# Patient Record
Sex: Female | Born: 1995 | Race: Black or African American | Hispanic: No | Marital: Single | Smoking: Current every day smoker
Health system: Southern US, Community
[De-identification: ages and names within clinical notes are randomized; demographics above are authoritative.]

---
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
## 2015-07-14 ENCOUNTER — Emergency Department (HOSPITAL_COMMUNITY): Payer: Medicaid Other

## 2015-07-14 ENCOUNTER — Encounter (HOSPITAL_COMMUNITY): Payer: Self-pay | Admitting: Family Medicine

## 2015-07-14 ENCOUNTER — Emergency Department (HOSPITAL_COMMUNITY)
Admission: EM | Admit: 2015-07-14 | Discharge: 2015-07-14 | Disposition: A | Discharge status: Home / Self Care | Payer: Medicaid Other | Attending: Emergency Medicine | Admitting: Emergency Medicine

## 2015-07-14 DIAGNOSIS — R079 Chest pain, unspecified: Secondary | ICD-10-CM | POA: Insufficient documentation

## 2015-07-14 DIAGNOSIS — Z79899 Other long term (current) drug therapy: Secondary | ICD-10-CM | POA: Diagnosis not present

## 2015-07-14 DIAGNOSIS — J069 Acute upper respiratory infection, unspecified: Secondary | ICD-10-CM | POA: Diagnosis not present

## 2015-07-14 DIAGNOSIS — Z7982 Long term (current) use of aspirin: Secondary | ICD-10-CM | POA: Diagnosis not present

## 2015-07-14 DIAGNOSIS — B9789 Other viral agents as the cause of diseases classified elsewhere: Secondary | ICD-10-CM

## 2015-07-14 DIAGNOSIS — R05 Cough: Secondary | ICD-10-CM | POA: Diagnosis present

## 2015-07-14 DIAGNOSIS — F17200 Nicotine dependence, unspecified, uncomplicated: Secondary | ICD-10-CM | POA: Diagnosis not present

## 2015-07-14 LAB — RAPID STREP SCREEN (MED CTR MEBANE ONLY): STREPTOCOCCUS, GROUP A SCREEN (DIRECT): NEGATIVE

## 2015-07-14 MED ORDER — ACETAMINOPHEN 160 MG/5ML PO SOLN
500.0000 mg | Freq: Once | ORAL | Status: AC
  Administered 2015-07-14: 500 mg via ORAL
  Filled 2015-07-14: qty 20.3

## 2015-07-14 NOTE — ED Notes (Signed)
Pt here for sore throat, cough, headache and body aches with chills.
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
## 2015-07-14 NOTE — ED Notes (Signed)
Patient transported to X-ray 

## 2015-07-16 LAB — CULTURE, GROUP A STREP (THRC)

## 2015-11-28 ENCOUNTER — Other Ambulatory Visit (HOSPITAL_COMMUNITY)
Admission: RE | Admit: 2015-11-28 | Discharge: 2015-11-28 | Disposition: A | Discharge status: Home / Self Care | Payer: Medicaid Other | Source: Ambulatory Visit | Attending: Internal Medicine | Admitting: Internal Medicine

## 2015-11-28 ENCOUNTER — Encounter: Payer: Self-pay | Admitting: Internal Medicine

## 2015-11-28 ENCOUNTER — Ambulatory Visit: Payer: Medicaid Other | Attending: Internal Medicine | Admitting: Internal Medicine

## 2015-11-28 VITALS — BP 93/63 | HR 74 | Temp 98.1°F | Resp 16 | Ht 61.5 in | Wt 139.4 lb

## 2015-11-28 DIAGNOSIS — Z131 Encounter for screening for diabetes mellitus: Secondary | ICD-10-CM

## 2015-11-28 DIAGNOSIS — N760 Acute vaginitis: Secondary | ICD-10-CM | POA: Insufficient documentation

## 2015-11-28 DIAGNOSIS — Z113 Encounter for screening for infections with a predominantly sexual mode of transmission: Secondary | ICD-10-CM | POA: Diagnosis present

## 2015-11-28 DIAGNOSIS — Z124 Encounter for screening for malignant neoplasm of cervix: Secondary | ICD-10-CM | POA: Diagnosis not present

## 2015-11-28 DIAGNOSIS — F17210 Nicotine dependence, cigarettes, uncomplicated: Secondary | ICD-10-CM | POA: Diagnosis not present

## 2015-11-28 DIAGNOSIS — Z720 Tobacco use: Secondary | ICD-10-CM

## 2015-11-28 DIAGNOSIS — Z0000 Encounter for general adult medical examination without abnormal findings: Secondary | ICD-10-CM | POA: Diagnosis not present

## 2015-11-28 DIAGNOSIS — Z01419 Encounter for gynecological examination (general) (routine) without abnormal findings: Secondary | ICD-10-CM | POA: Insufficient documentation

## 2015-11-28 DIAGNOSIS — F17200 Nicotine dependence, unspecified, uncomplicated: Secondary | ICD-10-CM

## 2015-11-28 DIAGNOSIS — Z1329 Encounter for screening for other suspected endocrine disorder: Secondary | ICD-10-CM | POA: Diagnosis not present

## 2015-11-28 LAB — POCT URINE PREGNANCY: PREG TEST UR: NEGATIVE

## 2015-11-28 LAB — BASIC METABOLIC PANEL WITH GFR
BUN: 12 mg/dL (ref 7–25)
CO2: 23 mmol/L (ref 20–31)
CREATININE: 0.96 mg/dL (ref 0.50–1.10)
Calcium: 9.5 mg/dL (ref 8.6–10.2)
Chloride: 106 mmol/L (ref 98–110)
GFR, Est African American: >89 mL/min (ref >=60)
GFR, Est Non African American: 85 mL/min (ref >=60)
Glucose, Bld: 79 mg/dL (ref 65–99)
POTASSIUM: 4.4 mmol/L (ref 3.5–5.3)
SODIUM: 137 mmol/L (ref 135–146)

## 2015-11-28 LAB — POCT URINALYSIS DIPSTICK
Bilirubin, UA: NEGATIVE
Glucose, UA: NEGATIVE
KETONES UA: NEGATIVE
LEUKOCYTES UA: NEGATIVE
Nitrite, UA: NEGATIVE
PH UA: 6
Protein, UA: NEGATIVE
Spec Grav, UA: 1.02
Urobilinogen, UA: 0.2

## 2015-11-28 LAB — CBC WITH DIFFERENTIAL/PLATELET
BASOS PCT: 0 %
Basophils Absolute: 0 cells/uL (ref 0–200)
EOS PCT: 2 %
Eosinophils Absolute: 106 cells/uL (ref 15–500)
HCT: 40.3 % (ref 35.0–45.0)
Hemoglobin: 12.9 g/dL (ref 11.7–15.5)
Lymphocytes Relative: 39 %
Lymphs Abs: 2067 cells/uL (ref 850–3900)
MCH: 27 pg (ref 27.0–33.0)
MCHC: 32 g/dL (ref 32.0–36.0)
MCV: 84.3 fL (ref 80.0–100.0)
MONOS PCT: 6 %
MPV: 10.1 fL (ref 7.5–12.5)
Monocytes Absolute: 318 cells/uL (ref 200–950)
Neutro Abs: 2809 cells/uL (ref 1500–7800)
Neutrophils Relative %: 53 %
Platelets: 317 10*3/uL (ref 140–400)
RBC: 4.78 MIL/uL (ref 3.80–5.10)
RDW: 13.4 % (ref 11.0–15.0)
WBC: 5.3 10*3/uL (ref 3.8–10.8)

## 2015-11-28 LAB — TSH: TSH: 0.96 m[IU]/L

## 2015-11-28 LAB — POCT GLYCOSYLATED HEMOGLOBIN (HGB A1C): HEMOGLOBIN A1C: 5.4

## 2015-11-28 NOTE — Progress Notes (Signed)
Dawn Brewer, is a 20 y.o. female  ZOX:[id number]  [id number]  DOB - 1995-12-21  CC:  Chief Complaint  Patient presents with  . Establish Care       HPI: Dawn Brewer is a 20 y.o. female g0p0 here today to establish medical care, new to our clinic.  Last seen in ED 07/14/15 for uri, resolved.  She has no c/o today, regular menses. Is in sexual relationship w/ longterm partner of 3 years, female, not using OCP or protection at this time.  Never had papsmear prior.  Denies any abnml nipple discharge, no abnml vaginal discharge, no pruritis.   Smokes 2-3 cig /day and occasionally marijuana.    Patient has No headache, No chest pain, No abdominal pain - No Nausea, No new weakness tingling or numbness, No Cough - SOB.    Review of Systems: Per HPI, o/w all systems reviewed and negative.   No Known Allergies No past medical history on file. No current outpatient prescriptions on file prior to visit.   No current facility-administered medications on file prior to visit.    No family history on file. Social History   Social History  . Marital status: Single    Spouse name: N/A  . Number of children: N/A  . Years of education: N/A   Occupational History  . Not on file.   Social History Main Topics  . Smoking status: Current Every Day Smoker  . Smokeless tobacco: Not on file  . Alcohol use No  . Drug use: No  . Sexual activity: Yes    Birth control/ protection: Injection   Other Topics Concern  . Not on file   Social History Narrative  . No narrative on file    Objective:   Vitals:   11/28/15 1015  BP: 93/63  Pulse: 74  Resp: 16  Temp: 98.1 F (36.7 C)    Filed Weights   11/28/15 1015  Weight: 139 lb 6.4 oz (63.2 kg)    BP Readings from Last 3 Encounters:  11/28/15 93/63  07/14/15 98/68  05/18/14 106/67  gyn exam done w/ CMA assistance.  Physical Exam: Constitutional: Patient appears well-developed and well-nourished. No distress.  AAOx3 HENT: Normocephalic, atraumatic, External right and left ear normal. Oropharynx is clear and moist.  bilat TMs clear. Eyes: Conjunctivae and EOM are normal. PERRL, no scleral icterus. Neck: Normal ROM. Neck supple. No JVD.  CVS: RRR, S1/S2 +, no murmurs, no gallops, no carotid bruit.  Breast/axilla: bilateral exam unremarkable, no palpable masses/nodules, no abnml discharge Pulmonary: Effort and breath sounds normal, no stridor, rhonchi, wheezes, rales.  Abdominal: Soft. BS +, no distension, tenderness, rebound or guarding.  Pelvic Exam: Cervix normal in appearance, low, retrograde cervix, external genitalia normal, no adnexal masses or tenderness, no cervical motion tenderness, rectovaginal septum normal, uterus normal size, shape, and consistency and vagina normal with thick white discharge, no foul odor.  Musculoskeletal: Normal range of motion. No edema and no tenderness.  LE: bilat/ no c/c/e, pulses 2+ bilateral. Lymphadenopathy: No lymphadenopathy noted, cervical  Neuro: Alert. muscle tone coordination wnl. No cranial nerve deficit grossly. Skin: Skin is warm and dry. No rash noted. Not diaphoretic. No erythema. No pallor. Psychiatric: Normal mood and affect. Behavior, judgment, thought content normal.  Lab Results  Component Value Date   WBC 8.3 07/01/2013   HGB 13.0 07/01/2013   HCT 38.1 07/01/2013   MCV 82.5 07/01/2013   PLT 230 07/01/2013   Lab Results  Component Value Date  CREATININE 0.91 07/01/2013   BUN 4 (L) 07/01/2013   NA 138 07/01/2013   K 3.0 (L) 07/01/2013   CL 98 07/01/2013   CO2 23 07/01/2013    No results found for: HGBA1C Lipid Panel  No results found for: CHOL, TRIG, HDL, CHOLHDL, VLDL, LDLCALC     Depression screen PHQ 2/9 11/28/2015  Decreased Interest 1  Down, Depressed, Hopeless 2  PHQ - 2 Score 3  Altered sleeping 0  Tired, decreased energy 1  Change in appetite 0  Feeling bad or failure about yourself  1  Trouble concentrating 0    Moving slowly or fidgety/restless 0  Suicidal thoughts 0  PHQ-9 Score 5    Assessment and plan:   1. Pap smear for cervical cancer screening - Cytology - PAP - POCT urine pregnancy - Urinalysis Dipstick  2. Diabetes mellitus screening - POCT glycosylated hemoglobin (Hb A1C)  3. Screening for thyroid disorder - TSH  4. Health maintenance examination - CBC with Differential/Platelet - BASIC METABOLIC PANEL WITH GFR - HIV antibody (with reflex)  5. Smoking (tob and marijuana) Complete cessation recd,   6. Currently sexually active, not on ocp, not using protection - recd safe sex practices, recd birth control, pt will consider.   Return in about 6 months (around 05/30/2016), or if symptoms worsen or fail to improve.  The patient was given clear instructions to go to ER or return to medical center if symptoms don't improve, worsen or new problems develop. The patient verbalized understanding. The patient was told to call to get lab results if they haven't heard anything in the next week.    This note has been created with Education officer, environmental. Any transcriptional errors are unintentional.   Pete Glatter, MD, MBA/MHA [facility] [place], [place] [phone]   11/28/2015, 11:09 AM

## 2015-11-28 NOTE — Patient Instructions (Signed)
You Can Quit Smoking  If you are ready to quit smoking or are thinking about it, congratulations! You have chosen to help yourself be healthier and live longer! There are lots of different ways to quit smoking. Nicotine gum, nicotine patches, a nicotine inhaler, or nicotine nasal spray can help with physical craving. Hypnosis, support groups, and medicines help break the habit of smoking.  TIPS TO GET OFF AND STAY OFF CIGARETTES  · Learn to predict your moods. Do not let a bad situation be your excuse to have a cigarette. Some situations in your life might tempt you to have a cigarette.  · Ask friends and co-workers not to smoke around you.  · Make your home smoke-free.  · Never have "just one" cigarette. It leads to wanting another and another. Remind yourself of your decision to quit.  · On a card, make a list of your reasons for not smoking. Read it at least the same number of times a day as you have a cigarette. Tell yourself everyday, "I do not want to smoke. I choose not to smoke."  · Ask someone at home or work to help you with your plan to quit smoking.  · Have something planned after you eat or have a cup of coffee. Take a walk or get other exercise to perk you up. This will help to keep you from overeating.  · Try a relaxation exercise to calm you down and decrease your stress. Remember, you may be tense and nervous the first two weeks after you quit. This will pass.  · Find new activities to keep your hands busy. Play with a pen, coin, or rubber band. Doodle or draw things on paper.  · Brush your teeth right after eating. This will help cut down the craving for the taste of tobacco after meals. You can try mouthwash too.  · Try gum, breath mints, or diet candy to keep something in your mouth.  IF YOU SMOKE AND WANT TO QUIT:  · Do not stock up on cigarettes. Never buy a carton. Wait until one pack is finished before you buy another.  · Never carry cigarettes with you at work or at home.  · Keep cigarettes  as far away from you as possible. Leave them with someone else.  · Never carry matches or a lighter with you.  · Ask yourself, "Do I need this cigarette or is this just a reflex?"  · Bet with someone that you can quit. Put cigarette money in a piggy bank every morning. If you smoke, you give up the money. If you do not smoke, by the end of the week, you keep the money.  · Keep trying. It takes 21 days to change a habit!  · Talk to your doctor about using medicines to help you quit. These include nicotine replacement gum, lozenges, or skin patches.     This information is not intended to replace advice given to you by your health care provider. Make sure you discuss any questions you have with your health care provider.     Document Released: 02/08/2009 Document Revised: 07/07/2011 Document Reviewed: 02/08/2009  Elsevier Interactive Patient Education ©2016 [employer]    Health Maintenance, Female  Adopting a healthy lifestyle and getting preventive care can go a long way to promote health and wellness. Talk with your health care provider about what schedule of regular examinations is right for you. This is a good chance for you to check in with   your provider about disease prevention and staying healthy.  In between checkups, there are plenty of things you can do on your own. Experts have done a lot of research about which lifestyle changes and preventive measures are most likely to keep you healthy. Ask your health care provider for more information.  WEIGHT AND DIET   Eat a healthy diet  · Be sure to include plenty of vegetables, fruits, low-fat dairy products, and lean protein.  · Do not eat a lot of foods high in solid fats, added sugars, or salt.  · Get regular exercise. This is one of the most important things you can do for your health.  ¨ Most adults should exercise for at least 150 minutes each week. The exercise should increase your heart rate and make you sweat (moderate-intensity exercise).  ¨ Most  adults should also do strengthening exercises at least twice a week. This is in addition to the moderate-intensity exercise.    Maintain a healthy weight  · Body mass index (BMI) is a measurement that can be used to identify possible weight problems. It estimates body fat based on height and weight. Your health care provider can help determine your BMI and help you achieve or maintain a healthy weight.  · For females 20 years of age and older:      A BMI below 18.5 is considered underweight.    A BMI of 18.5 to 24.9 is normal.    A BMI of 25 to 29.9 is considered overweight.    A BMI of 30 and above is considered obese.    Watch levels of cholesterol and blood lipids  · You should start having your blood tested for lipids and cholesterol at 20 years of age, then have this test every 5 years.  · You may need to have your cholesterol levels checked more often if:  ¨ Your lipid or cholesterol levels are high.  ¨ You are older than 20 years of age.  ¨ You are at high risk for heart disease.    CANCER SCREENING      Lung Cancer  · Lung cancer screening is recommended for adults 55-80 years old who are at high risk for lung cancer because of a history of smoking.  · A yearly low-dose CT scan of the lungs is recommended for people who:  ¨ Currently smoke.  ¨ Have quit within the past 15 years.  ¨ Have at least a 30-pack-year history of smoking. A pack year is smoking an average of one pack of cigarettes a day for 1 year.  · Yearly screening should continue until it has been 15 years since you quit.  · Yearly screening should stop if you develop a health problem that would prevent you from having lung cancer treatment.    Breast Cancer  · Practice breast self-awareness. This means understanding how your breasts normally appear and feel.  · It also means doing regular breast self-exams. Let your health care provider know about any changes, no matter how small.  · If you are in your 20s or 30s, you should have a clinical  breast exam (CBE) by a health care provider every 1-3 years as part of a regular health exam.  · If you are 40 or older, have a CBE every year. Also consider having a breast X-ray (mammogram) every year.  · If you have a family history of breast cancer, talk to your health care provider about genetic screening.  · If you are at high risk for breast   cancer, talk to your health care provider about having an MRI and a mammogram every year.  · Breast cancer gene (BRCA) assessment is recommended for women who have family members with BRCA-related cancers. BRCA-related cancers include:  ¨ Breast.  ¨ Ovarian.  ¨ Tubal.  ¨ Peritoneal cancers.  · Results of the assessment will determine the need for genetic counseling and BRCA1 and BRCA2 testing.  Cervical Cancer  Your health care provider may recommend that you be screened regularly for cancer of the pelvic organs (ovaries, uterus, and vagina). This screening involves a pelvic examination, including checking for microscopic changes to the surface of your cervix (Pap test). You may be encouraged to have this screening done every 3 years, beginning at age 21.  · For women ages 30-65, health care providers may recommend pelvic exams and Pap testing every 3 years, or they may recommend the Pap and pelvic exam, combined with testing for human papilloma virus (HPV), every 5 years. Some types of HPV increase your risk of cervical cancer. Testing for HPV may also be done on women of any age with unclear Pap test results.  · Other health care providers may not recommend any screening for nonpregnant women who are considered low risk for pelvic cancer and who do not have symptoms. Ask your health care provider if a screening pelvic exam is right for you.  · If you have had past treatment for cervical cancer or a condition that could lead to cancer, you need Pap tests and screening for cancer for at least 20 years after your treatment. If Pap tests have been discontinued, your risk  factors (such as having a new sexual partner) need to be reassessed to determine if screening should resume. Some women have medical problems that increase the chance of getting cervical cancer. In these cases, your health care provider may recommend more frequent screening and Pap tests.  Colorectal Cancer  · This type of cancer can be detected and often prevented.  · Routine colorectal cancer screening usually begins at 20 years of age and continues through 20 years of age.  · Your health care provider may recommend screening at an earlier age if you have risk factors for colon cancer.  · Your health care provider may also recommend using home test kits to check for hidden blood in the stool.  · A small camera at the end of a tube can be used to examine your colon directly (sigmoidoscopy or colonoscopy). This is done to check for the earliest forms of colorectal cancer.  · Routine screening usually begins at age 50.  · Direct examination of the colon should be repeated every 5-10 years through 20 years of age. However, you may need to be screened more often if early forms of precancerous polyps or small growths are found.  Skin Cancer  · Check your skin from head to toe regularly.  · Tell your health care provider about any new moles or changes in moles, especially if there is a change in a mole's shape or color.  · Also tell your health care provider if you have a mole that is larger than the size of a pencil eraser.  · Always use sunscreen. Apply sunscreen liberally and repeatedly throughout the day.  · Protect yourself by wearing long sleeves, pants, a wide-brimmed hat, and sunglasses whenever you are outside.  HEART DISEASE, DIABETES, AND HIGH BLOOD PRESSURE   · High blood pressure causes heart disease and increases the risk of   stroke. High blood pressure is more likely to develop in:    People who have blood pressure in the high end of the normal range (130-139/85-89 mm Hg).    People who are overweight or  obese.    People who are African American.  · If you are 18-39 years of age, have your blood pressure checked every 3-5 years. If you are 40 years of age or older, have your blood pressure checked every year. You should have your blood pressure measured twice--once when you are at a hospital or clinic, and once when you are not at a hospital or clinic. Record the average of the two measurements. To check your blood pressure when you are not at a hospital or clinic, you can use:    An automated blood pressure machine at a pharmacy.    A home blood pressure monitor.  · If you are between 55 years and 79 years old, ask your health care provider if you should take aspirin to prevent strokes.  · Have regular diabetes screenings. This involves taking a blood sample to check your fasting blood sugar level.    If you are at a normal weight and have a low risk for diabetes, have this test once every three years after 20 years of age.    If you are overweight and have a high risk for diabetes, consider being tested at a younger age or more often.  PREVENTING INFECTION   Hepatitis B  · If you have a higher risk for hepatitis B, you should be screened for this virus. You are considered at high risk for hepatitis B if:    You were born in a country where hepatitis B is common. Ask your health care provider which countries are considered high risk.    Your parents were born in a high-risk country, and you have not been immunized against hepatitis B (hepatitis B vaccine).    You have HIV or AIDS.    You use needles to inject street drugs.    You live with someone who has hepatitis B.    You have had sex with someone who has hepatitis B.    You get hemodialysis treatment.    You take certain medicines for conditions, including cancer, organ transplantation, and autoimmune conditions.  Hepatitis C  · Blood testing is recommended for:  ¨ Everyone born from 1945 through 1965.  ¨ Anyone with known risk factors for hepatitis  C.  Sexually transmitted infections (STIs)  · You should be screened for sexually transmitted infections (STIs) including gonorrhea and chlamydia if:  ¨ You are sexually active and are younger than 20 years of age.  ¨ You are older than 20 years of age and your health care provider tells you that you are at risk for this type of infection.  ¨ Your sexual activity has changed since you were last screened and you are at an increased risk for chlamydia or gonorrhea. Ask your health care provider if you are at risk.    If you do not have HIV, but are at risk, it may be recommended that you take a prescription medicine daily to prevent HIV infection. This is called pre-exposure prophylaxis (PrEP). You are considered at risk if:    You are sexually active and do not regularly use condoms or know the HIV status of your partner(s).    You take drugs by injection.    You are sexually active with a partner who has   HIV.  Talk with your health care provider about whether you are at high risk of being infected with HIV. If you choose to begin PrEP, you should first be tested for HIV. You should then be tested every 3 months for as long as you are taking PrEP.   PREGNANCY   · If you are premenopausal and you may become pregnant, ask your health care provider about preconception counseling.  · If you may become pregnant, take 400 to 800 micrograms (mcg) of folic acid every day.  · If you want to prevent pregnancy, talk to your health care provider about birth control (contraception).  OSTEOPOROSIS AND MENOPAUSE   · Osteoporosis is a disease in which the bones lose minerals and strength with aging. This can result in serious bone fractures. Your risk for osteoporosis can be identified using a bone density scan.  · If you are 65 years of age or older, or if you are at risk for osteoporosis and fractures, ask your health care provider if you should be screened.  · Ask your health care provider whether you should take a calcium or  vitamin D supplement to lower your risk for osteoporosis.  · Menopause may have certain physical symptoms and risks.  · Hormone replacement therapy may reduce some of these symptoms and risks.  Talk to your health care provider about whether hormone replacement therapy is right for you.   HOME CARE INSTRUCTIONS   · Schedule regular health, dental, and eye exams.  · Stay current with your immunizations.    · Do not use any tobacco products including cigarettes, chewing tobacco, or electronic cigarettes.  · If you are pregnant, do not drink alcohol.  · If you are breastfeeding, limit how much and how often you drink alcohol.  · Limit alcohol intake to no more than 1 drink per day for nonpregnant women. One drink equals 12 ounces of beer, 5 ounces of wine, or 1½ ounces of hard liquor.  · Do not use street drugs.  · Do not share needles.  · Ask your health care provider for help if you need support or information about quitting drugs.  · Tell your health care provider if you often feel depressed.  · Tell your health care provider if you have ever been abused or do not feel safe at home.     This information is not intended to replace advice given to you by your health care provider. Make sure you discuss any questions you have with your health care provider.     Document Released: 10/28/2010 Document Revised: 05/05/2014 Document Reviewed: 03/16/2013  Elsevier Interactive Patient Education ©2016 [employer]

## 2015-11-29 LAB — CERVICOVAGINAL ANCILLARY ONLY: WET PREP (BD AFFIRM): POSITIVE — AB

## 2015-11-29 LAB — HIV ANTIBODY (ROUTINE TESTING W REFLEX): HIV 1&2 Ab, 4th Generation: NONREACTIVE

## 2015-11-29 LAB — CYTOLOGY - PAP

## 2015-11-30 ENCOUNTER — Telehealth: Payer: Self-pay

## 2015-11-30 ENCOUNTER — Other Ambulatory Visit: Payer: Self-pay | Admitting: Internal Medicine

## 2015-11-30 MED ORDER — METRONIDAZOLE 500 MG PO TABS: 500.0000 mg | ORAL_TABLET | Freq: Two times a day (BID) | ORAL | 0 refills | Status: DC

## 2015-11-30 NOTE — Telephone Encounter (Signed)
Contacted pt to go over lab results pt was not available left message with mom informing her to have the pt contact me

## 2015-11-30 NOTE — Telephone Encounter (Signed)
LM with mother

## 2015-12-03 ENCOUNTER — Telehealth: Payer: Self-pay | Admitting: Internal Medicine

## 2015-12-03 NOTE — Telephone Encounter (Signed)
Returned pt call lvm for pt to give me a call back the office at her earliest convenience

## 2015-12-03 NOTE — Telephone Encounter (Signed)
Pt. Returned call. Please f/u °

## 2015-12-04 LAB — CERVICOVAGINAL ANCILLARY ONLY: HERPES (WINDOWPATH): NEGATIVE

## 2015-12-05 ENCOUNTER — Telehealth: Payer: Self-pay

## 2015-12-05 NOTE — Telephone Encounter (Signed)
Contacted pt to go over lab results pt was not available lm with mother to have pt to give me a call at her earliest convenience. Will be mailing letter out today

## 2016-02-24 ENCOUNTER — Encounter (HOSPITAL_COMMUNITY): Payer: Self-pay | Admitting: Emergency Medicine

## 2016-02-24 ENCOUNTER — Ambulatory Visit (HOSPITAL_COMMUNITY)
Admission: EM | Admit: 2016-02-24 | Discharge: 2016-02-24 | Disposition: A | Discharge status: Home / Self Care | Payer: Medicaid Other | Attending: Emergency Medicine | Admitting: Emergency Medicine

## 2016-02-24 DIAGNOSIS — L03011 Cellulitis of right finger: Secondary | ICD-10-CM | POA: Diagnosis not present

## 2016-02-24 MED ORDER — CEPHALEXIN 500 MG PO CAPS: 500.0000 mg | ORAL_CAPSULE | Freq: Four times a day (QID) | ORAL | 0 refills | Status: DC

## 2016-02-24 NOTE — ED Provider Notes (Signed)
CSN: [id number]     Arrival date & time 02/24/16  1713 History   First MD Initiated Contact with Patient 02/24/16 1917     Chief Complaint  Patient presents with  . Hand Pain   (Consider location/radiation/quality/duration/timing/severity/associated sxs/prior Treatment) 20 year old female complaining of pain around the right middle fingernail for 4-5 days. She states part to 3 weeks ago she went to a nail parlor to have her nails treated. The right third finger nail fell off and as a result she started having pain at the cuticle or base of the nail.      History reviewed. No pertinent past medical history. History reviewed. No pertinent surgical history. History reviewed. No pertinent family history. Social History  Substance Use Topics  . Smoking status: Current Every Day Smoker  . Smokeless tobacco: Never Used  . Alcohol use No   OB History    No data available     Review of Systems  Constitutional: Negative.   HENT: Negative.   Respiratory: Negative.   Skin: Negative for rash.       As per history of present illness.  Neurological: Negative.   All other systems reviewed and are negative.   Allergies  Review of patient's allergies indicates no known allergies.  Home Medications   Prior to Admission medications   Medication Sig Start Date End Date Taking? Authorizing Provider  cephALEXin (KEFLEX) 500 MG capsule Take 1 capsule (500 mg total) by mouth 4 (four) times daily. 02/24/16   Hayden Rasmussenavid Lisa Milian, NP  metroNIDAZOLE (FLAGYL) 500 MG tablet Take 1 tablet (500 mg total) by mouth 2 (two) times daily. 11/30/15   Pete Glatterawn T Langeland, MD   Meds Ordered and Administered this Visit  Medications - No data to display  BP 122/68 (BP Location: Left Arm)   Pulse 72   Temp 98.2 F (36.8 C) (Oral)   Resp 16   LMP 02/09/2016 (Exact Date)   SpO2 99%  No data found.   Physical Exam  Constitutional: She is oriented to person, place, and time. She appears well-developed and  well-nourished. No distress.  HENT:  Head: Normocephalic and atraumatic.  Right Ear: External ear normal.  Left Ear: External ear normal.  Neck: Normal range of motion. Neck supple.  Cardiovascular: Normal rate.   Pulmonary/Chest: Effort normal.  Musculoskeletal: Normal range of motion. She exhibits no edema.  Neurological: She is alert and oriented to person, place, and time.  Skin: Skin is warm and dry.  Right long finger nail with tenderness along the nail edge at the cuticle. There is no fluctuance but there is minor erythema. No evidence of purulence accumulation. No exudates, bleeding or drainage. Positive for local tenderness. No tenderness to the joint or remainder of the finger. Normal range of motion.  Psychiatric: She has a normal mood and affect.  Nursing note and vitals reviewed.   Urgent Care Course   Clinical Course    Procedures (including critical care time)  Labs Review Labs Reviewed - No data to display  Imaging Review No results found.   Visual Acuity Review  Right Eye Distance:   Left Eye Distance:   Bilateral Distance:    Right Eye Near:   Left Eye Near:    Bilateral Near:         MDM   1. Paronychia of right middle finger    Soak in warm water 2-3 times a day. Take your antibiotic as directed. Wash your hands with soap and water 2 or  3 times a day. Keep the area clean. Do not try to scrape, pick at or peel the fingernail. If you develop pus under the skin at the base of the nail and the pain gets worse he will need to return to have the wound opened up. Meds ordered this encounter  Medications  . cephALEXin (KEFLEX) 500 MG capsule    Sig: Take 1 capsule (500 mg total) by mouth 4 (four) times daily.    Dispense:  28 capsule    Refill:  0    Order Specific Question:   Supervising Provider    Answer:   Domenick GongMORTENSON, ASHLEY [[id number]]       Hayden Rasmussenavid Mackenzee Becvar, NP 02/24/16 1933

## 2016-02-24 NOTE — ED Triage Notes (Signed)
[facility] a [facility] of pain and swelling to her right hand middle finger that started 5 days ago when her manicured nail came off.

## 2016-02-24 NOTE — Discharge Instructions (Signed)
Soak in warm water 2-3 times a day. Take your antibiotic as directed. Wash her hands with soap and water 2 or 3 times a day. Keep the area clean. Do not try to scrape, pick at or peel the fingernail. If you develop pus under the skin at the base of the nail and the pain gets worse he will need to return to have the wound opened up.

## 2016-05-21 ENCOUNTER — Ambulatory Visit (HOSPITAL_COMMUNITY)
Admission: EM | Admit: 2016-05-21 | Discharge: 2016-05-21 | Disposition: A | Discharge status: Home / Self Care | Payer: Medicaid Other | Attending: Emergency Medicine | Admitting: Emergency Medicine

## 2016-05-21 ENCOUNTER — Encounter (HOSPITAL_COMMUNITY): Payer: Self-pay | Admitting: Family Medicine

## 2016-05-21 DIAGNOSIS — O26899 Other specified pregnancy related conditions, unspecified trimester: Secondary | ICD-10-CM | POA: Insufficient documentation

## 2016-05-21 DIAGNOSIS — R110 Nausea: Secondary | ICD-10-CM

## 2016-05-21 DIAGNOSIS — Z3490 Encounter for supervision of normal pregnancy, unspecified, unspecified trimester: Secondary | ICD-10-CM

## 2016-05-21 DIAGNOSIS — O99330 Smoking (tobacco) complicating pregnancy, unspecified trimester: Secondary | ICD-10-CM | POA: Insufficient documentation

## 2016-05-21 DIAGNOSIS — F17200 Nicotine dependence, unspecified, uncomplicated: Secondary | ICD-10-CM | POA: Insufficient documentation

## 2016-05-21 DIAGNOSIS — Z3201 Encounter for pregnancy test, result positive: Secondary | ICD-10-CM

## 2016-05-21 DIAGNOSIS — R197 Diarrhea, unspecified: Secondary | ICD-10-CM | POA: Insufficient documentation

## 2016-05-21 DIAGNOSIS — Z3A00 Weeks of gestation of pregnancy not specified: Secondary | ICD-10-CM | POA: Insufficient documentation

## 2016-05-21 LAB — POCT PREGNANCY, URINE: PREG TEST UR: POSITIVE — AB

## 2016-05-21 MED ORDER — ONDANSETRON 4 MG PO TBDP: 4.0000 mg | ORAL_TABLET | Freq: Three times a day (TID) | ORAL | 0 refills | Status: DC | PRN

## 2016-05-21 NOTE — ED Provider Notes (Signed)
CSN: [id number]     Arrival date & time 05/21/16  1841 History   First MD Initiated Contact with Patient 05/21/16 1918     Chief Complaint  Patient presents with  . Emesis  . Diarrhea   (Consider location/radiation/quality/duration/timing/severity/associated sxs/prior Treatment) Patient c/o nausea for 5 days. She states she is late for her menses.  Her LMP was 5 December.     The history is provided by the patient.  Emesis  Severity:  Moderate Duration:  5 days Timing:  Constant Quality:  Stomach contents Able to tolerate:  Liquids How soon after eating does vomiting occur:  1 hour Progression:  Unchanged Chronicity:  New Recent urination:  Normal Context: post-tussive   Relieved by:  Nothing Worsened by:  Nothing Ineffective treatments:  None tried Associated symptoms: diarrhea   Diarrhea  Associated symptoms: vomiting     History reviewed. No pertinent past medical history. History reviewed. No pertinent surgical history. History reviewed. No pertinent family history. Social History  Substance Use Topics  . Smoking status: Current Every Day Smoker  . Smokeless tobacco: Never Used  . Alcohol use No   OB History    No data available     Review of Systems  Constitutional: Negative.   HENT: Negative.   Eyes: Negative.   Cardiovascular: Negative.   Gastrointestinal: Positive for diarrhea and vomiting.  Endocrine: Negative.   Genitourinary: Negative.   Musculoskeletal: Negative.   Allergic/Immunologic: Negative.   Neurological: Negative.   Hematological: Negative.   Psychiatric/Behavioral: Negative.     Allergies  Patient has no known allergies.  Home Medications   Prior to Admission medications   Medication Sig Start Date End Date Taking? Authorizing Provider  ondansetron (ZOFRAN ODT) 4 MG disintegrating tablet Take 1 tablet (4 mg total) by mouth every 8 (eight) hours as needed for nausea or vomiting. 05/21/16   Deatra CanterWilliam J Gianelle Mccaul, FNP   Meds Ordered and  Administered this Visit  Medications - No data to display  BP 116/72   Pulse 84   Temp 99.1 F (37.3 C)   Resp 18   LMP 04/06/2016   SpO2 100%  No data found.   Physical Exam  Constitutional: She appears well-developed and well-nourished.  HENT:  Head: Normocephalic and atraumatic.  Right Ear: External ear normal.  Left Ear: External ear normal.  Mouth/Throat: Oropharynx is clear and moist.  Eyes: Conjunctivae and EOM are normal. Pupils are equal, round, and reactive to light.  Neck: Normal range of motion. Neck supple.  Cardiovascular: Normal rate, regular rhythm and normal heart sounds.   Pulmonary/Chest: Effort normal and breath sounds normal.  Abdominal: Soft. Bowel sounds are normal.  Nursing note and vitals reviewed.   Urgent Care Course     Procedures (including critical care time)  Labs Review Labs Reviewed  POCT PREGNANCY, URINE - Abnormal; Notable for the following:       Result Value   Preg Test, Ur POSITIVE (*)    All other components within normal limits    Imaging Review No results found.   Visual Acuity Review  Right Eye Distance:   Left Eye Distance:   Bilateral Distance:    Right Eye Near:   Left Eye Near:    Bilateral Near:         MDM   1. Nausea   2. Pregnancy, unspecified gestational age    zofran odt 4mg  one po tid prn #20      Deatra CanterWilliam J Bayler Nehring, FNP 05/21/16 2021

## 2016-05-21 NOTE — ED Triage Notes (Addendum)
Pt here for N,V,D for 5 days. sts vomiting 2 times a day. Drinking ginger ale.

## 2016-05-22 ENCOUNTER — Other Ambulatory Visit (HOSPITAL_BASED_OUTPATIENT_CLINIC_OR_DEPARTMENT_OTHER): Payer: Self-pay | Admitting: Internal Medicine

## 2016-05-22 ENCOUNTER — Ambulatory Visit: Payer: Medicaid Other | Attending: Internal Medicine

## 2016-05-22 ENCOUNTER — Encounter: Payer: Self-pay | Admitting: Licensed Clinical Social Worker

## 2016-05-22 DIAGNOSIS — N912 Amenorrhea, unspecified: Secondary | ICD-10-CM

## 2016-05-22 LAB — POCT URINE PREGNANCY: PREG TEST UR: POSITIVE — AB

## 2016-05-22 NOTE — BH Specialist Note (Signed)
Pt requested to speak with LCSWA after finding out about positive pregnancy test. LCSWA introduced self and explained [facility] at [facility].   Pt requested pregnancy termination resources. She stated that she resides with her mother and is currently employed; however, she is unable to care for baby and the father of baby is uninvolved. LCSWA provided pt with requested information. Pt shared that she is currently grieving the loss of her long-time boyfriend of over three years. LCSWA discussed the benefits of grief counseling. Pt verbalized interest in services and was provided information on hospice for individual and group sessions. No additional concerns noted.  Pt was provided LCSWA's contact information and was strongly encouraged to initiate contact for any additional resources. Pt was appreciative for assistance.

## 2016-05-22 NOTE — Progress Notes (Signed)
Patient here for lab visit only 

## 2016-05-23 ENCOUNTER — Ambulatory Visit: Payer: Medicaid Other

## 2016-05-23 LAB — HCG, QUANTITATIVE, PREGNANCY: HCG, BETA CHAIN, QUANT, S: 71077.5 m[IU]/mL — AB

## 2018-03-11 ENCOUNTER — Other Ambulatory Visit: Payer: Self-pay

## 2018-03-11 ENCOUNTER — Ambulatory Visit (HOSPITAL_COMMUNITY)
Admission: EM | Admit: 2018-03-11 | Discharge: 2018-03-11 | Disposition: A | Discharge status: Home / Self Care | Payer: Managed Care, Other (non HMO) | Attending: Family Medicine | Admitting: Family Medicine

## 2018-03-11 ENCOUNTER — Encounter (HOSPITAL_COMMUNITY): Payer: Self-pay

## 2018-03-11 DIAGNOSIS — N926 Irregular menstruation, unspecified: Secondary | ICD-10-CM | POA: Diagnosis present

## 2018-03-11 DIAGNOSIS — F17200 Nicotine dependence, unspecified, uncomplicated: Secondary | ICD-10-CM | POA: Insufficient documentation

## 2018-03-11 DIAGNOSIS — Z3202 Encounter for pregnancy test, result negative: Secondary | ICD-10-CM

## 2018-03-11 DIAGNOSIS — N939 Abnormal uterine and vaginal bleeding, unspecified: Secondary | ICD-10-CM

## 2018-03-11 LAB — POCT URINALYSIS DIP (DEVICE)
BILIRUBIN URINE: NEGATIVE
GLUCOSE, UA: NEGATIVE mg/dL
KETONES UR: NEGATIVE mg/dL
LEUKOCYTES UA: NEGATIVE
Nitrite: NEGATIVE
PROTEIN: NEGATIVE mg/dL
SPECIFIC GRAVITY, URINE: 1.025 (ref 1.005–1.030)
Urobilinogen, UA: 0.2 mg/dL (ref 0.0–1.0)
pH: 6.5 (ref 5.0–8.0)

## 2018-03-11 LAB — POCT PREGNANCY, URINE: Preg Test, Ur: NEGATIVE

## 2018-03-11 NOTE — ED Provider Notes (Signed)
[facility]    CSN: [id number] Arrival date & time: 03/11/18  [id number]     History   Chief Complaint Chief Complaint  Patient presents with  . irregular peiod    HPI Guss BundeJataya T Valdez is a 22 y.o. female.   Allie DimmerJataya presents with complaints of abnormal vaginal bleeding. States LMP 10/27- 11/3. Started spotting yesterday with some flow of bleeding last night. Light bleeding still today. No other discharge, itching, lesions, sores. No pelvic pain. States no cramping which is typical of her periods. No fevers, no back pain. No urinary symptoms. She is not on birth control. Sexually active with 1 partner, doesn't use condoms. Denies concerns for STD's. No dizziness, lightheadedness. Denies any previous similar, states her periods are typically pretty regular. Doesn't have a gynecologist. Without contributing medical history.      ROS per HPI.      History reviewed. No pertinent past medical history.  There are no active problems to display for this patient.   History reviewed. No pertinent surgical history.  OB History   None      Home Medications    Prior to Admission medications   Medication Sig Start Date End Date Taking? Authorizing Provider  ondansetron (ZOFRAN ODT) 4 MG disintegrating tablet Take 1 tablet (4 mg total) by mouth every 8 (eight) hours as needed for nausea or vomiting. 05/21/16   Deatra Canterxford, William J, FNP    Family History History reviewed. No pertinent family history.  Social History Social History   Tobacco Use  . Smoking status: Current Every Day Smoker  . Smokeless tobacco: Never Used  Substance Use Topics  . Alcohol use: No  . Drug use: No     Allergies   Patient has no known allergies.   Review of Systems Review of Systems   Physical Exam Triage Vital Signs ED Triage Vitals  Enc Vitals Group     BP 03/11/18 0915 96/70     Pulse Rate 03/11/18 0915 67     Resp 03/11/18 0915 16     Temp 03/11/18 0915 97.7 F (36.5 C)       Temp Source 03/11/18 0915 Oral     SpO2 03/11/18 0915 98 %     Weight 03/11/18 0916 160 lb (72.6 kg)     Height --      Head Circumference --      Peak Flow --      Pain Score 03/11/18 0915 7     Pain Loc --      Pain Edu? --      Excl. in [place]? --    No data found.  Updated Vital Signs BP 96/70 (BP Location: Left Arm)   Pulse 67   Temp 97.7 F (36.5 C) (Oral)   Resp 16   Wt 160 lb (72.6 kg)   SpO2 98%   BMI 29.74 kg/m    Physical Exam  Constitutional: She is oriented to person, place, and time. She appears well-developed and well-nourished. No distress.  Cardiovascular: Normal rate, regular rhythm and normal heart sounds.  Pulmonary/Chest: Effort normal and breath sounds normal.  Abdominal: Soft. There is no tenderness. There is no rigidity, no rebound, no guarding and no CVA tenderness.  Genitourinary: There is no rash or lesion on the right labia. There is no rash or lesion on the left labia. There is bleeding in the vagina. No foreign body in the vagina.  Genitourinary Comments:  Small dark red vaginal bleeding, cervix without  any visual abnormalities, patient declines bimanual exam; no abdominal or pelvic pain  Neurological: She is alert and oriented to person, place, and time.  Skin: Skin is warm and dry.     UC Treatments / Results  Labs (all labs ordered are listed, but only abnormal results are displayed) Labs Reviewed  POCT URINALYSIS DIP (DEVICE) - Abnormal; Notable for the following components:      Result Value   Hgb urine dipstick MODERATE (*)    All other components within normal limits  POCT PREGNANCY, URINE  CERVICOVAGINAL ANCILLARY ONLY    EKG None  Radiology No results found.  Procedures Procedures (including critical care time)  Medications Ordered in UC Medications - No data to display  Initial Impression / Assessment and Plan / UC Course  I have reviewed the triage vital signs and the nursing notes.  Pertinent labs & imaging  results that were available during my care of the patient were reviewed by me and considered in my medical decision making (see chart for details).     Non toxic, afebrile. Stable vitals. Benign physical exam. Negative urine pregnancy. Vaginal cytology pending. Will notify of any positive findings and if any changes to treatment are needed.  Encouraged follow up with gynecology for persistent symptoms. Return precautions provided. Patient verbalized understanding and agreeable to plan.   Final Clinical Impressions(s) / UC Diagnoses   Final diagnoses:  Abnormal vaginal bleeding     Discharge Instructions     Negative for pregnancy here today, and no indication of urinary tract infection.  We will test for vaginal infections as this is a potential cause of irregular bleeding.  Will notify you of any positive findings and if any changes to treatment are needed.   May use ibuprofen and/or naproxen to help with any cramping and may help slightly with bleeding.  Please follow up with gynecology for persistent symptoms or continued irregular bleeding.  Return to be seen or go to ER if develop heavy bleeding >1 pad or tampon in an hour, pain, fevers, dizziness or light headedness.    ED Prescriptions    None     Controlled Substance Prescriptions [place] Controlled Substance Registry consulted? Not Applicable   Georgetta Haber, NP 03/11/18 1042

## 2018-03-11 NOTE — ED Triage Notes (Signed)
Pt cc she is having a irregular period. Pt states she just had a period ten days ago and now its back on.

## 2018-03-11 NOTE — Discharge Instructions (Signed)
Negative for pregnancy here today, and no indication of urinary tract infection.  We will test for vaginal infections as this is a potential cause of irregular bleeding.  Will notify you of any positive findings and if any changes to treatment are needed.   May use ibuprofen and/or naproxen to help with any cramping and may help slightly with bleeding.  Please follow up with gynecology for persistent symptoms or continued irregular bleeding.  Return to be seen or go to ER if develop heavy bleeding >1 pad or tampon in an hour, pain, fevers, dizziness or light headedness.

## 2018-03-12 LAB — CERVICOVAGINAL ANCILLARY ONLY
BACTERIAL VAGINITIS: POSITIVE — AB
Candida vaginitis: NEGATIVE
Chlamydia: NEGATIVE
Neisseria Gonorrhea: NEGATIVE
TRICH (WINDOWPATH): NEGATIVE

## 2018-03-13 ENCOUNTER — Telehealth (HOSPITAL_COMMUNITY): Payer: Self-pay

## 2018-03-13 MED ORDER — METRONIDAZOLE 500 MG PO TABS: 500.0000 mg | ORAL_TABLET | Freq: Two times a day (BID) | ORAL | 0 refills | Status: DC

## 2018-03-13 NOTE — Telephone Encounter (Signed)
Bacterial vaginosis is positive. This was not treated at the urgent care visit.  Flagyl 500 mg BID x 7 days #14 no refills sent to patients pharmacy of choice.  Attempted to reach patient, no answer.

## 2018-11-29 ENCOUNTER — Telehealth: Payer: Self-pay | Admitting: Advanced Practice Midwife

## 2018-11-29 NOTE — Telephone Encounter (Signed)
The patient visited our office to schedule an appointment. Advised of the covid19 restrictions. The patient also answered no the covid screening.

## 2018-11-30 ENCOUNTER — Ambulatory Visit: Payer: Medicaid Other | Admitting: Student

## 2018-11-30 ENCOUNTER — Other Ambulatory Visit (HOSPITAL_COMMUNITY)
Admission: RE | Admit: 2018-11-30 | Discharge: 2018-11-30 | Disposition: A | Discharge status: Home / Self Care | Payer: Managed Care, Other (non HMO) | Source: Ambulatory Visit | Attending: Student | Admitting: Student

## 2018-11-30 ENCOUNTER — Other Ambulatory Visit: Payer: Self-pay

## 2018-11-30 VITALS — BP 118/75 | HR 102 | Temp 98.6°F | Ht 61.0 in | Wt 146.8 lb

## 2018-11-30 DIAGNOSIS — Z0000 Encounter for general adult medical examination without abnormal findings: Secondary | ICD-10-CM | POA: Diagnosis not present

## 2018-11-30 DIAGNOSIS — Z01419 Encounter for gynecological examination (general) (routine) without abnormal findings: Secondary | ICD-10-CM | POA: Diagnosis not present

## 2018-11-30 NOTE — Progress Notes (Signed)
No exposure but wants testing -declines birth control -pap today -encouraged to download mychart  History:  [name] is a 23 y.o. No obstetric history on file. who presents to clinic today for repeat pap. She also wants STI testing. She denies any s/s of STI or genito-urinary complaints. She does not want birth control; she prefers to use the morning after pill as needed.   The following portions of the patient's history were reviewed and updated as appropriate: allergies, current medications, family history, past medical history, social history, past surgical history and problem list.  Review of Systems:  Review of Systems  Constitutional: Negative.   HENT: Negative.   Cardiovascular: Negative.   Gastrointestinal: Negative.   Genitourinary: Negative.   Skin: Negative.   Neurological: Negative.       Objective:  Physical Exam BP 118/75   Pulse (!) 102   Temp 98.6 F (37 C)   Ht 5\' 1"  (1.549 m)   Wt 146 lb 12.8 oz (66.6 kg)   LMP 11/27/2018 (Exact Date)   BMI 27.74 kg/m  Physical Exam  Constitutional: She appears well-developed.  HENT:  Head: Normocephalic.  Eyes: Pupils are equal, round, and reactive to light.  Neck: Normal range of motion.  Respiratory: Effort normal.  GI: Soft.  Genitourinary:    Vaginal discharge present.     Genitourinary Comments: NEFG; trace amount of dark brown blood in the vagina; otherwise normal anatomy, no CMT, suprapubic or adnexal tenderness.    Musculoskeletal: Normal range of motion.  Neurological: She is alert.  Skin: Skin is warm and dry.  Breast exam benign; no lumps or masses.     Labs and Imaging Results for orders placed or performed in visit on 11/30/18 (from the past 24 hour(s))  RPR     Status: None   Collection Time: 11/30/18  3:41 PM  Result Value Ref Range   RPR Ser Ql Non Reactive Non Reactive   Narrative   Performed at:  [address] - [facility] [address], [place], [place]  [id number] Lab  Director: Jolene SchimkeSanjai Nagendra MD, Phone:  [phone]  HIV Antibody (routine testing w rflx)     Status: None   Collection Time: 11/30/18  3:41 PM  Result Value Ref Range   HIV Screen 4th Generation wRfx Non Reactive Non Reactive   Narrative   Performed at:  [address] - [facility] [place] [address], [place], [place]  [id number] Lab Director: Jolene SchimkeSanjai Nagendra MD, Phone:  [phone]  Hepatitis B Surface AntiGEN     Status: None   Collection Time: 11/30/18  3:41 PM  Result Value Ref Range   Hepatitis B Surface Ag Negative Negative   Narrative   Performed at:  [address] - [facility] [address], [place], [place]  [id number] Lab Director: Jolene SchimkeSanjai Nagendra MD, Phone:  [phone]  Hepatitis C Antibody     Status: None   Collection Time: 11/30/18  3:41 PM  Result Value Ref Range   Hep C Virus Ab <0.1 0.0 - 0.9 s/co ratio   Narrative   Performed at:  [address] - [facility] [place] [address], [place], [place]  [id number] Lab Director: Jolene SchimkeSanjai Nagendra MD, Phone:  [phone]  POCT urinalysis dip (device)     Status: Abnormal   Collection Time: 11/30/18  3:47 PM  Result Value Ref Range   Glucose, UA NEGATIVE NEGATIVE mg/dL   Bilirubin Urine NEGATIVE NEGATIVE   Ketones, ur NEGATIVE NEGATIVE mg/dL   Specific Gravity, Urine 1.025 1.005 - 1.030  Hgb urine dipstick MODERATE (A) NEGATIVE   pH 6.5 5.0 - 8.0   Protein, ur 30 (A) NEGATIVE mg/dL   Urobilinogen, UA 0.2 0.0 - 1.0 mg/dL   Nitrite NEGATIVE NEGATIVE   Leukocytes,Ua MODERATE (A) NEGATIVE    No results found.   Assessment & Plan:  1. Women's annual routine gynecological examination -Discussed with patient that EC is not 100% and that she can't count on that for successful pregnancy preventing. Strongly encouraged LARC method; patient unsure and will think about it.  - Cytology - PAP - Cervicovaginal ancillary only( [facility]) - RPR - HIV Antibody (routine testing w rflx) - Hepatitis B Surface AntiGEN - Hepatitis C Antibody   Starr Lake, CNM 12/01/2018 12:27 PM

## 2018-12-01 LAB — POCT URINALYSIS DIP (DEVICE)
Bilirubin Urine: NEGATIVE
Glucose, UA: NEGATIVE mg/dL
Ketones, ur: NEGATIVE mg/dL
Nitrite: NEGATIVE
Protein, ur: 30 mg/dL — AB
Specific Gravity, Urine: 1.025 (ref 1.005–1.030)
Urobilinogen, UA: 0.2 mg/dL (ref 0.0–1.0)
pH: 6.5 (ref 5.0–8.0)

## 2018-12-01 LAB — HEPATITIS B SURFACE ANTIGEN: Hepatitis B Surface Ag: NEGATIVE

## 2018-12-01 LAB — RPR: RPR Ser Ql: NONREACTIVE

## 2018-12-01 LAB — HEPATITIS C ANTIBODY: Hep C Virus Ab: <0.1 s/co ratio (ref 0.0–0.9)

## 2018-12-01 LAB — HIV ANTIBODY (ROUTINE TESTING W REFLEX): HIV Screen 4th Generation wRfx: NONREACTIVE

## 2018-12-02 LAB — CYTOLOGY - PAP
Chlamydia: NEGATIVE
Diagnosis: NEGATIVE
Neisseria Gonorrhea: POSITIVE — AB

## 2018-12-04 LAB — CERVICOVAGINAL ANCILLARY ONLY
Candida vaginitis: NEGATIVE
Trichomonas: NEGATIVE

## 2018-12-05 ENCOUNTER — Encounter: Payer: Self-pay | Admitting: Student

## 2018-12-05 DIAGNOSIS — A549 Gonococcal infection, unspecified: Secondary | ICD-10-CM | POA: Insufficient documentation

## 2018-12-06 ENCOUNTER — Telehealth: Payer: Self-pay | Admitting: Family Medicine

## 2018-12-06 NOTE — Telephone Encounter (Signed)
Attempted to reach patient about coming in for an appointment. Mother stated she was not in, but would have her to call us.

## 2018-12-10 ENCOUNTER — Other Ambulatory Visit: Payer: Self-pay

## 2018-12-10 ENCOUNTER — Ambulatory Visit (INDEPENDENT_AMBULATORY_CARE_PROVIDER_SITE_OTHER): Payer: Managed Care, Other (non HMO)

## 2018-12-10 VITALS — Wt 143.7 lb

## 2018-12-10 DIAGNOSIS — A549 Gonococcal infection, unspecified: Secondary | ICD-10-CM | POA: Diagnosis not present

## 2018-12-10 MED ORDER — CEFTRIAXONE SODIUM 250 MG IJ SOLR
250.0000 mg | Freq: Once | INTRAMUSCULAR | Status: AC
  Administered 2018-12-10: 250 mg via INTRAMUSCULAR

## 2018-12-10 MED ORDER — AZITHROMYCIN 250 MG PO TABS
1000.0000 mg | ORAL_TABLET | Freq: Once | ORAL | Status: AC
  Administered 2018-12-10: 1000 mg via ORAL

## 2018-12-10 NOTE — Progress Notes (Signed)
Pt here today for tx of gonorrhea.  Pt tolerated Zithromax and Rocephin IM well.  Pt advised to make sure that she tell her partner(s).  Pt stated that she was not planning on doing so.  Pt informs me that it was multiple partner(s).  I expressed to the pt that it is important that she tells them because it will spread and infect others.  Pt explains that she has been having pelvic pain with some spotting.  I informed pt that it could be related to the STI and another reason on how important it is to inform her partners.  Pt verbalized understanding.   Mel Almond, RN 12/10/18

## 2018-12-15 NOTE — Progress Notes (Signed)
I have reviewed this chart and agree with the RN/CMA assessment and management.    Eoghan Belcher C Della Scrivener, MD, FACOG Attending Physician, [facility]  

## 2019-01-31 ENCOUNTER — Ambulatory Visit: Payer: Managed Care, Other (non HMO)

## 2019-01-31 ENCOUNTER — Telehealth: Payer: Self-pay | Admitting: Family Medicine

## 2019-01-31 NOTE — Telephone Encounter (Signed)
Called the patient to confirm the upcoming visit. The patient answered no to the covid screening questions. Also informed the patient of no children or visitors due to the covid restrictions. The patient verbalized understanding. °

## 2019-02-01 ENCOUNTER — Other Ambulatory Visit: Payer: Self-pay

## 2019-02-01 ENCOUNTER — Ambulatory Visit (INDEPENDENT_AMBULATORY_CARE_PROVIDER_SITE_OTHER): Payer: Managed Care, Other (non HMO)

## 2019-02-01 DIAGNOSIS — Z3202 Encounter for pregnancy test, result negative: Secondary | ICD-10-CM | POA: Diagnosis not present

## 2019-02-01 DIAGNOSIS — Z3200 Encounter for pregnancy test, result unknown: Secondary | ICD-10-CM

## 2019-02-01 LAB — POCT PREGNANCY, URINE: Preg Test, Ur: NEGATIVE

## 2019-02-01 NOTE — Progress Notes (Signed)
Pt here today for UPT; test result is negative. Notified pt result was negative. Pt reports coming today because she has been nauseas recently and was concerned she may be pregnant. Pt is on her period today. Discussed birth control with pt; she states she is not interested at this time. Pt has no further questions or concerns and will continue to follow up with our office as needed.   Apolonio Schneiders RN 02/01/19

## 2019-02-08 NOTE — Progress Notes (Signed)
Chart reviewed for nurse visit. Agree with plan of care.   Starr Lake, CNM 02/08/2019 2:13 PM

## 2019-05-22 ENCOUNTER — Encounter (HOSPITAL_COMMUNITY): Payer: Self-pay

## 2019-05-22 ENCOUNTER — Ambulatory Visit (HOSPITAL_COMMUNITY)
Admission: EM | Admit: 2019-05-22 | Discharge: 2019-05-22 | Disposition: A | Discharge status: Home / Self Care | Payer: Managed Care, Other (non HMO) | Attending: Family Medicine | Admitting: Family Medicine

## 2019-05-22 ENCOUNTER — Other Ambulatory Visit: Payer: Self-pay

## 2019-05-22 DIAGNOSIS — T63304A Toxic effect of unspecified spider venom, undetermined, initial encounter: Secondary | ICD-10-CM

## 2019-05-22 DIAGNOSIS — S80262A Insect bite (nonvenomous), left knee, initial encounter: Secondary | ICD-10-CM

## 2019-05-22 MED ORDER — DOXYCYCLINE HYCLATE 100 MG PO CAPS: 100.0000 mg | ORAL_CAPSULE | Freq: Two times a day (BID) | ORAL | 0 refills | Status: DC

## 2019-05-22 NOTE — ED Triage Notes (Signed)
Pt presents with insect bite on left knee that is very painful and inflamed.

## 2019-05-24 NOTE — ED Provider Notes (Signed)
[facility]    CSN: [id number] Arrival date & time: 05/22/19  1515      History   Chief Complaint Chief Complaint  Patient presents with  . Insect Bite    HPI Dawn Brewer is a 24 y.o. female.   HPI  History reviewed. No pertinent past medical history.  Patient Active Problem List   Diagnosis Date Noted  . Gonorrhea 12/05/2018    History reviewed. No pertinent surgical history.  OB History   No obstetric history on file.      Home Medications    Prior to Admission medications   Medication Sig Start Date End Date Taking? Authorizing Provider  doxycycline (VIBRAMYCIN) 100 MG capsule Take 1 capsule (100 mg total) by mouth 2 (two) times daily. 05/22/19   Wardell Honour, MD    Family History Family History  Family history unknown: Yes    Social History Social History   Tobacco Use  . Smoking status: Current Every Day Smoker  . Smokeless tobacco: Never Used  Substance Use Topics  . Alcohol use: No  . Drug use: No     Allergies   Patient has no known allergies.   Review of Systems Review of Systems  Skin: Positive for wound.     Physical Exam Triage Vital Signs ED Triage Vitals  Enc Vitals Group     BP 05/22/19 1551 126/73     Pulse Rate 05/22/19 1551 85     Resp 05/22/19 1551 16     Temp 05/22/19 1551 98.4 F (36.9 C)     Temp Source 05/22/19 1551 Oral     SpO2 05/22/19 1551 99 %     Weight --      Height --      Head Circumference --      Peak Flow --      Pain Score 05/22/19 1601 9     Pain Loc --      Pain Edu? --      Excl. in [place]? --    No data found.  Updated Vital Signs BP 126/73 (BP Location: Left Arm)   Pulse 85   Temp 98.4 F (36.9 C) (Oral)   Resp 16   LMP 05/01/2019   SpO2 99%   Visual Acuity Right Eye Distance:   Left Eye Distance:   Bilateral Distance:    Right Eye Near:   Left Eye Near:    Bilateral Near:     Physical Exam Vitals reviewed.  Constitutional:      Appearance: Normal  appearance. She is normal weight.  Skin:    Comments: Dime size lesion lateral aspect of left knee.  There is some surrounding erythema suspicious for cellulitis.  Neurological:     Mental Status: She is alert.      UC Treatments / Results  Labs (all labs ordered are listed, but only abnormal results are displayed) Labs Reviewed - No data to display  EKG   Radiology No results found.  Procedures Procedures (including critical care time)  Medications Ordered in UC Medications - No data to display  Initial Impression / Assessment and Plan / UC Course  I have reviewed the triage vital signs and the nursing notes.  Pertinent labs & imaging results that were available during my care of the patient were reviewed by me and considered in my medical decision making (see chart for details).     Probable spider bite right knee.  Will treat with antibiotic  for possible cellulitis Final Clinical Impressions(s) / UC Diagnoses   Final diagnoses:  Spider bite wound, undetermined intent, initial encounter   Discharge Instructions   None    ED Prescriptions    Medication Sig Dispense Auth. Provider   doxycycline (VIBRAMYCIN) 100 MG capsule Take 1 capsule (100 mg total) by mouth 2 (two) times daily. 20 capsule Frederica Kuster, MD     PDMP not reviewed this encounter.   Frederica Kuster, MD 05/24/19 Flossie Buffy

## 2019-12-13 ENCOUNTER — Ambulatory Visit (INDEPENDENT_AMBULATORY_CARE_PROVIDER_SITE_OTHER): Payer: Managed Care, Other (non HMO)

## 2019-12-13 ENCOUNTER — Other Ambulatory Visit (HOSPITAL_COMMUNITY)
Admission: RE | Admit: 2019-12-13 | Discharge: 2019-12-13 | Disposition: A | Discharge status: Home / Self Care | Payer: Managed Care, Other (non HMO) | Source: Ambulatory Visit | Attending: Obstetrics and Gynecology | Admitting: Obstetrics and Gynecology

## 2019-12-13 ENCOUNTER — Other Ambulatory Visit: Payer: Self-pay

## 2019-12-13 DIAGNOSIS — Z113 Encounter for screening for infections with a predominantly sexual mode of transmission: Secondary | ICD-10-CM | POA: Diagnosis present

## 2019-12-13 DIAGNOSIS — Z3202 Encounter for pregnancy test, result negative: Secondary | ICD-10-CM | POA: Diagnosis not present

## 2019-12-13 NOTE — Progress Notes (Signed)
Patient was assessed and managed by nursing staff during this encounter. I have reviewed the chart and agree with the documentation and plan. I have also made any necessary editorial changes.  Catalina Antigua, MD 12/13/2019 10:51 AM

## 2019-12-13 NOTE — Progress Notes (Signed)
Pt here today for UPT, which is Negative & STD screening, including Blood Work. Advised to check My Chart in a couple of days for the result of the Swab & towards the end or beginning of next week for Blood work results. Pt verbalized understanding.

## 2019-12-13 NOTE — Addendum Note (Signed)
Addended by: Henrietta Dine on: 12/13/2019 11:21 AM   Modules accepted: Level of Service

## 2019-12-14 ENCOUNTER — Other Ambulatory Visit: Payer: Self-pay | Admitting: Obstetrics and Gynecology

## 2019-12-14 LAB — CERVICOVAGINAL ANCILLARY ONLY
Bacterial Vaginitis (gardnerella): POSITIVE — AB
Candida Glabrata: NEGATIVE
Candida Vaginitis: NEGATIVE
Chlamydia: POSITIVE — AB
Comment: NEGATIVE
Comment: NEGATIVE
Comment: NEGATIVE
Comment: NEGATIVE
Comment: NEGATIVE
Comment: NORMAL
Neisseria Gonorrhea: NEGATIVE
Trichomonas: NEGATIVE

## 2019-12-14 LAB — HEPATITIS B SURFACE ANTIGEN: Hepatitis B Surface Ag: NEGATIVE

## 2019-12-14 LAB — HEPATITIS C ANTIBODY: Hep C Virus Ab: <0.1 s/co ratio (ref 0.0–0.9)

## 2019-12-14 LAB — HIV ANTIBODY (ROUTINE TESTING W REFLEX): HIV Screen 4th Generation wRfx: NONREACTIVE

## 2019-12-14 LAB — RPR: RPR Ser Ql: NONREACTIVE

## 2019-12-14 LAB — POCT PREGNANCY, URINE: Preg Test, Ur: NEGATIVE

## 2019-12-14 MED ORDER — AZITHROMYCIN 500 MG PO TABS: 1000.0000 mg | ORAL_TABLET | Freq: Once | ORAL | 1 refills | Status: AC

## 2019-12-14 MED ORDER — METRONIDAZOLE 500 MG PO TABS: 500.0000 mg | ORAL_TABLET | Freq: Two times a day (BID) | ORAL | 0 refills | Status: DC

## 2019-12-15 ENCOUNTER — Telehealth: Payer: Self-pay | Admitting: Lactation Services

## 2019-12-15 DIAGNOSIS — A749 Chlamydial infection, unspecified: Secondary | ICD-10-CM

## 2019-12-15 DIAGNOSIS — B9689 Other specified bacterial agents as the cause of diseases classified elsewhere: Secondary | ICD-10-CM

## 2019-12-15 NOTE — Telephone Encounter (Signed)
-----   Message from Catalina Antigua, MD sent at 12/14/2019  3:44 PM EDT ----- Please inform patient of both BV and chlamydia infection. Clhamydia infection is an STD and her partner needs to be informed and treated. Rx has been e-prescribed

## 2019-12-15 NOTE — Telephone Encounter (Signed)
Called patient with results. She had seen her results this morning. Reviewed to take the Azithromycin in one dose and the Flagyl for 1 week.   Patient informed that partner needs to be notified [facility]. Reviewed abstaining from sexual intercourse for 2 weeks after both partners are treated.   Patient is interested in testing for HSV, Hepatitis, RPR, and HIV at her annual appt in September. Noted on appointment noted.   STD card faxed to [facility].

## 2020-01-09 ENCOUNTER — Encounter: Payer: Self-pay | Admitting: Student

## 2020-01-09 ENCOUNTER — Other Ambulatory Visit: Payer: Self-pay

## 2020-01-09 ENCOUNTER — Other Ambulatory Visit (HOSPITAL_COMMUNITY)
Admission: RE | Admit: 2020-01-09 | Discharge: 2020-01-09 | Disposition: A | Discharge status: Home / Self Care | Payer: Managed Care, Other (non HMO) | Source: Ambulatory Visit | Attending: Student | Admitting: Student

## 2020-01-09 ENCOUNTER — Ambulatory Visit (INDEPENDENT_AMBULATORY_CARE_PROVIDER_SITE_OTHER): Payer: Managed Care, Other (non HMO) | Admitting: Student

## 2020-01-09 VITALS — BP 115/58 | HR 92 | Ht 61.0 in | Wt 143.0 lb

## 2020-01-09 DIAGNOSIS — Z8619 Personal history of other infectious and parasitic diseases: Secondary | ICD-10-CM | POA: Insufficient documentation

## 2020-01-09 DIAGNOSIS — Z01419 Encounter for gynecological examination (general) (routine) without abnormal findings: Secondary | ICD-10-CM

## 2020-01-09 DIAGNOSIS — A749 Chlamydial infection, unspecified: Secondary | ICD-10-CM | POA: Diagnosis not present

## 2020-01-09 NOTE — Progress Notes (Signed)
  History:  [name] is a 24 y.o. No obstetric history on file. who presents to clinic today for routine exam. She denies any complaints.  She reports her periods are regular, no clotting, no painful symptoms. She wants to be checked for STIs. She was recently treated for chlamydia; wants to have a test of cure. She denies any symptoms of STI today.   The following portions of the patient's history were reviewed and updated as appropriate: allergies, current medications, family history, past medical history, social history, past surgical history and problem list.  Review of Systems:  Review of Systems  Constitutional: Negative.   HENT: Negative.   Respiratory: Negative.   Cardiovascular: Negative.   Skin: Negative.       Objective:  Physical Exam BP (!) 115/58   Pulse 92   Ht 5\' 1"  (1.549 m)   Wt 143 lb (64.9 kg)   LMP 12/18/2019 (Exact Date)   BMI 27.02 kg/m  Physical Exam Constitutional:      Appearance: Normal appearance.  Cardiovascular:     Rate and Rhythm: Normal rate and regular rhythm.  Pulmonary:     Effort: Pulmonary effort is normal. No respiratory distress.     Breath sounds: Normal breath sounds. No stridor.  Musculoskeletal:        General: Normal range of motion.  Skin:    General: Skin is warm and dry.  Neurological:     Mental Status: She is alert.    Breast exam benign, no lumps, masses, tenderness or discharge. No lumps or masses in RL axilla.  Labs and Imaging No results found for this or any previous visit (from the past 24 hour(s)).  No results found.   Assessment & Plan:  1. Women's annual routine gynecological examination -Pap deferred today, will recheck for GC CT as it has been 3 weeks since treatment.  -Patient does not want BC, options discussed.  -Will not do STI blood work today as patient just had two weeks ago  -No other concerns or complaints today Approximately minutes of total time was spent with this patient on 20  min.  12/20/2019, CNM 01/09/2020 5:30 PM

## 2020-01-10 LAB — CERVICOVAGINAL ANCILLARY ONLY
Bacterial Vaginitis (gardnerella): NEGATIVE
Candida Glabrata: NEGATIVE
Candida Vaginitis: NEGATIVE
Chlamydia: NEGATIVE
Comment: NEGATIVE
Comment: NEGATIVE
Comment: NEGATIVE
Comment: NEGATIVE
Comment: NEGATIVE
Comment: NORMAL
Neisseria Gonorrhea: NEGATIVE
Trichomonas: NEGATIVE

## 2020-03-06 ENCOUNTER — Encounter: Payer: Self-pay | Admitting: *Deleted

## 2020-03-20 ENCOUNTER — Other Ambulatory Visit: Payer: Self-pay

## 2020-03-20 ENCOUNTER — Ambulatory Visit (INDEPENDENT_AMBULATORY_CARE_PROVIDER_SITE_OTHER): Payer: Managed Care, Other (non HMO) | Admitting: *Deleted

## 2020-03-20 ENCOUNTER — Encounter: Payer: Self-pay | Admitting: *Deleted

## 2020-03-20 ENCOUNTER — Other Ambulatory Visit (HOSPITAL_COMMUNITY)
Admission: RE | Admit: 2020-03-20 | Discharge: 2020-03-20 | Disposition: A | Discharge status: Home / Self Care | Payer: Managed Care, Other (non HMO) | Source: Ambulatory Visit | Attending: Family Medicine | Admitting: Family Medicine

## 2020-03-20 VITALS — BP 99/74 | HR 91 | Ht 61.0 in | Wt 153.0 lb

## 2020-03-20 DIAGNOSIS — Z7251 High risk heterosexual behavior: Secondary | ICD-10-CM | POA: Insufficient documentation

## 2020-03-20 DIAGNOSIS — N898 Other specified noninflammatory disorders of vagina: Secondary | ICD-10-CM | POA: Diagnosis present

## 2020-03-20 DIAGNOSIS — N926 Irregular menstruation, unspecified: Secondary | ICD-10-CM

## 2020-03-20 NOTE — Progress Notes (Signed)
Patient was assessed and managed by nursing staff during this encounter. I have reviewed the chart and agree with the documentation and plan. I have also made any necessary editorial changes.  Bernerd Limbo, CNM 03/20/2020 5:12 PM

## 2020-03-20 NOTE — Progress Notes (Signed)
Pt reports several episodes of unprotected sex approximately 8-10 days ago. She is having vaginal irritation x3 days and desires testing for STI's, yeast and BV. Self swab obtained and pt was advised that she will be notified of results as well as treatment indicated if any via Mychart. Pt reports also that she missed a period - LMP 02/16/20. UPT performed and is negative. Pt was advised to check home UPT in one week if she has not started a period or if she is having pregnancy sx. Pt stated that she does not desire pregnancy @ this time. Pt may call back or send Mychart message if she has questions in the future. Pt voiced understanding of all information and instructions given.

## 2020-03-21 LAB — CERVICOVAGINAL ANCILLARY ONLY
Bacterial Vaginitis (gardnerella): POSITIVE — AB
Candida Glabrata: NEGATIVE
Candida Vaginitis: POSITIVE — AB
Chlamydia: NEGATIVE
Comment: NEGATIVE
Comment: NEGATIVE
Comment: NEGATIVE
Comment: NEGATIVE
Comment: NEGATIVE
Comment: NORMAL
Neisseria Gonorrhea: NEGATIVE
Trichomonas: NEGATIVE

## 2020-03-21 LAB — POCT PREGNANCY, URINE: Preg Test, Ur: NEGATIVE

## 2020-03-26 DIAGNOSIS — B3731 Acute candidiasis of vulva and vagina: Secondary | ICD-10-CM

## 2020-03-26 DIAGNOSIS — B9689 Other specified bacterial agents as the cause of diseases classified elsewhere: Secondary | ICD-10-CM

## 2020-03-26 MED ORDER — TERCONAZOLE 0.4 % VA CREA: 1.0000 | TOPICAL_CREAM | Freq: Every day | VAGINAL | 0 refills | Status: DC

## 2020-03-26 MED ORDER — METRONIDAZOLE 500 MG PO TABS: 500.0000 mg | ORAL_TABLET | Freq: Two times a day (BID) | ORAL | 0 refills | Status: DC

## 2020-05-03 ENCOUNTER — Other Ambulatory Visit: Payer: Self-pay

## 2020-05-03 ENCOUNTER — Ambulatory Visit (INDEPENDENT_AMBULATORY_CARE_PROVIDER_SITE_OTHER): Payer: Medicaid Other | Admitting: *Deleted

## 2020-05-03 ENCOUNTER — Other Ambulatory Visit (HOSPITAL_COMMUNITY)
Admission: RE | Admit: 2020-05-03 | Discharge: 2020-05-03 | Disposition: A | Discharge status: Home / Self Care | Payer: Medicaid Other | Source: Ambulatory Visit | Attending: Family Medicine | Admitting: Family Medicine

## 2020-05-03 VITALS — BP 117/80 | HR 95 | Ht 61.0 in | Wt 143.0 lb

## 2020-05-03 DIAGNOSIS — N898 Other specified noninflammatory disorders of vagina: Secondary | ICD-10-CM | POA: Insufficient documentation

## 2020-05-03 DIAGNOSIS — Z202 Contact with and (suspected) exposure to infections with a predominantly sexual mode of transmission: Secondary | ICD-10-CM | POA: Insufficient documentation

## 2020-05-03 NOTE — Progress Notes (Signed)
Wants to do self swab because of vaginal irritation- feels like BV not gone away. Also wants to check for std because has had intercourse with new partner. Arlen Legendre,RN

## 2020-05-04 LAB — CERVICOVAGINAL ANCILLARY ONLY
Bacterial Vaginitis (gardnerella): NEGATIVE
Candida Glabrata: NEGATIVE
Candida Vaginitis: NEGATIVE
Chlamydia: NEGATIVE
Comment: NEGATIVE
Comment: NEGATIVE
Comment: NEGATIVE
Comment: NEGATIVE
Comment: NEGATIVE
Comment: NORMAL
Neisseria Gonorrhea: NEGATIVE
Trichomonas: NEGATIVE

## 2020-09-03 ENCOUNTER — Ambulatory Visit (INDEPENDENT_AMBULATORY_CARE_PROVIDER_SITE_OTHER): Payer: Self-pay | Admitting: *Deleted

## 2020-09-03 ENCOUNTER — Other Ambulatory Visit (HOSPITAL_COMMUNITY)
Admission: RE | Admit: 2020-09-03 | Discharge: 2020-09-03 | Disposition: A | Discharge status: Home / Self Care | Payer: Medicaid Other | Source: Ambulatory Visit | Attending: Family Medicine | Admitting: Family Medicine

## 2020-09-03 ENCOUNTER — Other Ambulatory Visit: Payer: Self-pay

## 2020-09-03 VITALS — BP 115/60 | HR 68 | Ht 61.0 in | Wt 155.1 lb

## 2020-09-03 DIAGNOSIS — Z9189 Other specified personal risk factors, not elsewhere classified: Secondary | ICD-10-CM | POA: Insufficient documentation

## 2020-09-03 NOTE — Progress Notes (Addendum)
Pt reports recent unprotected sex and desires testing for STI's.  She denies presence of vaginal discharge, itching or irritation. Pt declined offer of testing for HIV and Syphilis at this time. Self swab obtained and pt advised she will be notified of results and treatment indicated if any, via Mychart. She voiced understanding.     Chart reviewed for nurse visit. Agree with plan of care.   Currie Paris, NP 09/03/2020 9:12 AM

## 2020-09-04 LAB — CERVICOVAGINAL ANCILLARY ONLY
Chlamydia: NEGATIVE
Comment: NEGATIVE
Comment: NEGATIVE
Comment: NORMAL
Neisseria Gonorrhea: NEGATIVE
Trichomonas: NEGATIVE

## 2021-04-23 ENCOUNTER — Ambulatory Visit (INDEPENDENT_AMBULATORY_CARE_PROVIDER_SITE_OTHER): Payer: Self-pay

## 2021-04-23 ENCOUNTER — Other Ambulatory Visit: Payer: Self-pay

## 2021-04-23 ENCOUNTER — Other Ambulatory Visit (HOSPITAL_COMMUNITY)
Admission: RE | Admit: 2021-04-23 | Discharge: 2021-04-23 | Disposition: A | Discharge status: Home / Self Care | Payer: Medicaid Other | Source: Ambulatory Visit | Attending: Family Medicine | Admitting: Family Medicine

## 2021-04-23 VITALS — BP 114/82 | HR 85 | Wt 162.0 lb

## 2021-04-23 DIAGNOSIS — Z113 Encounter for screening for infections with a predominantly sexual mode of transmission: Secondary | ICD-10-CM

## 2021-04-23 NOTE — Progress Notes (Signed)
Here today for STD screening. Last annual visit 01/09/20. Recommended patient schedule provider visit for annual. Pt agreeable to schedule during check out. Desires vaginal swab and blood work. Self swab instructions given and specimen obtained. Pt taken to lab for blood draw. Explained we will contact patient with any abnormal results.  Fleet Contras RN 04/23/21

## 2021-04-24 LAB — CERVICOVAGINAL ANCILLARY ONLY
Chlamydia: NEGATIVE
Comment: NEGATIVE
Comment: NEGATIVE
Comment: NORMAL
Neisseria Gonorrhea: NEGATIVE
Trichomonas: NEGATIVE

## 2021-04-24 LAB — HEPATITIS B SURFACE ANTIGEN: Hepatitis B Surface Ag: NEGATIVE

## 2021-04-24 LAB — HIV ANTIBODY (ROUTINE TESTING W REFLEX): HIV Screen 4th Generation wRfx: NONREACTIVE

## 2021-04-24 LAB — HEPATITIS C ANTIBODY: Hep C Virus Ab: <0.1 s/co ratio (ref 0.0–0.9)

## 2021-04-24 LAB — RPR: RPR Ser Ql: NONREACTIVE

## 2021-06-24 ENCOUNTER — Ambulatory Visit: Payer: Medicaid Other | Admitting: Nurse Practitioner
